# Patient Record
Sex: Male | Born: 1985 | Hispanic: No | Marital: Married | Smoking: Never smoker
Health system: Southern US, Community
[De-identification: ages and names within clinical notes are randomized; demographics above are authoritative.]

## PROBLEM LIST (undated history)

## (undated) DIAGNOSIS — T4145XA Adverse effect of unspecified anesthetic, initial encounter: Secondary | ICD-10-CM

## (undated) DIAGNOSIS — I10 Essential (primary) hypertension: Secondary | ICD-10-CM

## (undated) DIAGNOSIS — R112 Nausea with vomiting, unspecified: Secondary | ICD-10-CM

## (undated) DIAGNOSIS — E7800 Pure hypercholesterolemia, unspecified: Secondary | ICD-10-CM

## (undated) DIAGNOSIS — Z9889 Other specified postprocedural states: Secondary | ICD-10-CM

## (undated) DIAGNOSIS — T8859XA Other complications of anesthesia, initial encounter: Secondary | ICD-10-CM

---
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
## 2019-01-27 ENCOUNTER — Emergency Department (HOSPITAL_BASED_OUTPATIENT_CLINIC_OR_DEPARTMENT_OTHER): Payer: BC Managed Care – PPO

## 2019-01-27 ENCOUNTER — Encounter (HOSPITAL_COMMUNITY)
Admission: EM | Disposition: A | Discharge status: Home / Self Care | Payer: Self-pay | Source: Home / Self Care | Attending: Emergency Medicine

## 2019-01-27 ENCOUNTER — Encounter (HOSPITAL_BASED_OUTPATIENT_CLINIC_OR_DEPARTMENT_OTHER): Payer: Self-pay | Admitting: *Deleted

## 2019-01-27 ENCOUNTER — Observation Stay (HOSPITAL_BASED_OUTPATIENT_CLINIC_OR_DEPARTMENT_OTHER)
Admission: EM | Admit: 2019-01-27 | Discharge: 2019-01-29 | Disposition: A | Discharge status: Home / Self Care | Payer: BC Managed Care – PPO | Attending: Surgery | Admitting: Surgery

## 2019-01-27 ENCOUNTER — Emergency Department (HOSPITAL_COMMUNITY): Payer: BC Managed Care – PPO | Admitting: Certified Registered Nurse Anesthetist

## 2019-01-27 ENCOUNTER — Other Ambulatory Visit: Payer: Self-pay

## 2019-01-27 DIAGNOSIS — E7800 Pure hypercholesterolemia, unspecified: Secondary | ICD-10-CM | POA: Diagnosis not present

## 2019-01-27 DIAGNOSIS — K3580 Unspecified acute appendicitis: Secondary | ICD-10-CM | POA: Diagnosis present

## 2019-01-27 DIAGNOSIS — K3533 Acute appendicitis with perforation and localized peritonitis, with abscess: Secondary | ICD-10-CM | POA: Diagnosis not present

## 2019-01-27 DIAGNOSIS — Z20828 Contact with and (suspected) exposure to other viral communicable diseases: Secondary | ICD-10-CM | POA: Insufficient documentation

## 2019-01-27 DIAGNOSIS — K353 Acute appendicitis with localized peritonitis, without perforation or gangrene: Secondary | ICD-10-CM

## 2019-01-27 DIAGNOSIS — I10 Essential (primary) hypertension: Secondary | ICD-10-CM | POA: Insufficient documentation

## 2019-01-27 DIAGNOSIS — Z79899 Other long term (current) drug therapy: Secondary | ICD-10-CM | POA: Diagnosis not present

## 2019-01-27 DIAGNOSIS — N200 Calculus of kidney: Secondary | ICD-10-CM

## 2019-01-27 DIAGNOSIS — R109 Unspecified abdominal pain: Secondary | ICD-10-CM | POA: Diagnosis present

## 2019-01-27 HISTORY — DX: Other complications of anesthesia, initial encounter: T88.59XA

## 2019-01-27 HISTORY — DX: Pure hypercholesterolemia, unspecified: E78.00

## 2019-01-27 HISTORY — PX: LAPAROSCOPIC APPENDECTOMY: SHX408

## 2019-01-27 HISTORY — DX: Essential (primary) hypertension: I10

## 2019-01-27 HISTORY — DX: Other specified postprocedural states: Z98.890

## 2019-01-27 HISTORY — DX: Other specified postprocedural states: R11.2

## 2019-01-27 LAB — CBC
HCT: 45.8 % (ref 39.0–52.0)
Hemoglobin: 15.4 g/dL (ref 13.0–17.0)
MCH: 30.2 pg (ref 26.0–34.0)
MCHC: 33.6 g/dL (ref 30.0–36.0)
MCV: 89.8 fL (ref 80.0–100.0)
Platelets: 211 10*3/uL (ref 150–400)
RBC: 5.1 MIL/uL (ref 4.22–5.81)
RDW: 12.9 % (ref 11.5–15.5)
WBC: 17.7 10*3/uL — ABNORMAL HIGH (ref 4.0–10.5)
nRBC: 0 % (ref 0.0–0.2)

## 2019-01-27 LAB — COMPREHENSIVE METABOLIC PANEL
ALT: 102 U/L — ABNORMAL HIGH (ref 0–44)
AST: 61 U/L — ABNORMAL HIGH (ref 15–41)
Albumin: 4.8 g/dL (ref 3.5–5.0)
Alkaline Phosphatase: 106 U/L (ref 38–126)
Anion gap: 10 (ref 5–15)
BUN: 10 mg/dL (ref 6–20)
CO2: 24 mmol/L (ref 22–32)
Calcium: 9.5 mg/dL (ref 8.9–10.3)
Chloride: 104 mmol/L (ref 98–111)
Creatinine, Ser: 0.86 mg/dL (ref 0.61–1.24)
GFR calc Af Amer: >60 mL/min (ref >60)
GFR calc non Af Amer: >60 mL/min (ref >60)
Glucose, Bld: 120 mg/dL — ABNORMAL HIGH (ref 70–99)
Potassium: 4.1 mmol/L (ref 3.5–5.1)
Sodium: 138 mmol/L (ref 135–145)
Total Bilirubin: 1.4 mg/dL — ABNORMAL HIGH (ref 0.3–1.2)
Total Protein: 8.5 g/dL — ABNORMAL HIGH (ref 6.5–8.1)

## 2019-01-27 LAB — URINALYSIS, ROUTINE W REFLEX MICROSCOPIC
Bilirubin Urine: NEGATIVE
Glucose, UA: NEGATIVE mg/dL
Hgb urine dipstick: NEGATIVE
Ketones, ur: NEGATIVE mg/dL
Leukocytes,Ua: NEGATIVE
Nitrite: NEGATIVE
Protein, ur: NEGATIVE mg/dL
Specific Gravity, Urine: 1.01 (ref 1.005–1.030)
pH: 7 (ref 5.0–8.0)

## 2019-01-27 LAB — SARS CORONAVIRUS 2 BY RT PCR (HOSPITAL ORDER, PERFORMED IN ~~LOC~~ HOSPITAL LAB): SARS Coronavirus 2: NEGATIVE

## 2019-01-27 LAB — LIPASE, BLOOD: Lipase: 30 U/L (ref 11–51)

## 2019-01-27 SURGERY — APPENDECTOMY, LAPAROSCOPIC
Anesthesia: General

## 2019-01-27 MED ORDER — FENTANYL CITRATE (PF) 100 MCG/2ML IJ SOLN
INTRAMUSCULAR | Status: AC
  Filled 2019-01-27: qty 2

## 2019-01-27 MED ORDER — LACTATED RINGERS IR SOLN
Status: DC | PRN
  Administered 2019-01-27: 1000 mL

## 2019-01-27 MED ORDER — LACTATED RINGERS IV SOLN
INTRAVENOUS | Status: DC
  Administered 2019-01-27 (×2): via INTRAVENOUS

## 2019-01-27 MED ORDER — OXYCODONE HCL 5 MG PO TABS: 5.0000 mg | ORAL_TABLET | Freq: Once | ORAL | Status: DC | PRN

## 2019-01-27 MED ORDER — KCL IN DEXTROSE-NACL 20-5-0.45 MEQ/L-%-% IV SOLN
INTRAVENOUS | Status: DC
  Administered 2019-01-27 – 2019-01-28 (×3): via INTRAVENOUS
  Filled 2019-01-27 (×3): qty 1000

## 2019-01-27 MED ORDER — ACETAMINOPHEN 80 MG PO CHEW
160.0000 mg | CHEWABLE_TABLET | Freq: Once | ORAL | Status: DC
  Filled 2019-01-27: qty 2

## 2019-01-27 MED ORDER — BUPIVACAINE HCL 0.25 % IJ SOLN
INTRAMUSCULAR | Status: DC | PRN
  Administered 2019-01-27: 30 mL

## 2019-01-27 MED ORDER — DEXAMETHASONE SODIUM PHOSPHATE 10 MG/ML IJ SOLN
INTRAMUSCULAR | Status: DC | PRN
  Administered 2019-01-27: 8 mg via INTRAVENOUS

## 2019-01-27 MED ORDER — KETOROLAC TROMETHAMINE 30 MG/ML IJ SOLN: 30.0000 mg | Freq: Once | INTRAMUSCULAR | Status: DC | PRN

## 2019-01-27 MED ORDER — ONDANSETRON HCL 4 MG/2ML IJ SOLN
INTRAMUSCULAR | Status: DC | PRN
  Administered 2019-01-27: 4 mg via INTRAVENOUS

## 2019-01-27 MED ORDER — ASPIRIN 81 MG PO CHEW
CHEWABLE_TABLET | ORAL | Status: AC
  Filled 2019-01-27: qty 2

## 2019-01-27 MED ORDER — IOHEXOL 300 MG/ML  SOLN
100.0000 mL | Freq: Once | INTRAMUSCULAR | Status: AC | PRN
  Administered 2019-01-27: 100 mL via INTRAVENOUS

## 2019-01-27 MED ORDER — ROCURONIUM BROMIDE 10 MG/ML (PF) SYRINGE
PREFILLED_SYRINGE | INTRAVENOUS | Status: AC
  Filled 2019-01-27: qty 10

## 2019-01-27 MED ORDER — HYDROCODONE-ACETAMINOPHEN 5-325 MG PO TABS: 1.0000 | ORAL_TABLET | ORAL | Status: DC | PRN

## 2019-01-27 MED ORDER — ENOXAPARIN SODIUM 40 MG/0.4ML ~~LOC~~ SOLN
40.0000 mg | SUBCUTANEOUS | Status: DC
  Administered 2019-01-28 – 2019-01-29 (×2): 40 mg via SUBCUTANEOUS
  Filled 2019-01-27 (×2): qty 0.4

## 2019-01-27 MED ORDER — LIDOCAINE 2% (20 MG/ML) 5 ML SYRINGE
INTRAMUSCULAR | Status: DC | PRN
  Administered 2019-01-27: 40 mg via INTRAVENOUS

## 2019-01-27 MED ORDER — SODIUM CHLORIDE 0.9 % IV SOLN
2.0000 g | Freq: Once | INTRAVENOUS | Status: AC
  Administered 2019-01-27: 13:00:00 2 g via INTRAVENOUS
  Filled 2019-01-27: qty 20

## 2019-01-27 MED ORDER — SUCCINYLCHOLINE CHLORIDE 200 MG/10ML IV SOSY
PREFILLED_SYRINGE | INTRAVENOUS | Status: AC
  Filled 2019-01-27: qty 10

## 2019-01-27 MED ORDER — SUGAMMADEX SODIUM 200 MG/2ML IV SOLN
INTRAVENOUS | Status: DC | PRN
  Administered 2019-01-27: 200 mg via INTRAVENOUS

## 2019-01-27 MED ORDER — SODIUM CHLORIDE 0.9 % IV BOLUS
1000.0000 mL | Freq: Once | INTRAVENOUS | Status: AC
  Administered 2019-01-27: 1000 mL via INTRAVENOUS

## 2019-01-27 MED ORDER — MIDAZOLAM HCL 2 MG/2ML IJ SOLN
INTRAMUSCULAR | Status: DC | PRN
  Administered 2019-01-27: 2 mg via INTRAVENOUS

## 2019-01-27 MED ORDER — ONDANSETRON 4 MG PO TBDP: 4.0000 mg | ORAL_TABLET | Freq: Four times a day (QID) | ORAL | Status: DC | PRN

## 2019-01-27 MED ORDER — METRONIDAZOLE IN NACL 5-0.79 MG/ML-% IV SOLN
500.0000 mg | Freq: Once | INTRAVENOUS | Status: AC
  Administered 2019-01-27: 500 mg via INTRAVENOUS
  Filled 2019-01-27: qty 100

## 2019-01-27 MED ORDER — ONDANSETRON HCL 4 MG/2ML IJ SOLN
4.0000 mg | Freq: Once | INTRAMUSCULAR | Status: AC
  Administered 2019-01-27: 13:00:00 4 mg via INTRAVENOUS
  Filled 2019-01-27: qty 2

## 2019-01-27 MED ORDER — LIDOCAINE 2% (20 MG/ML) 5 ML SYRINGE
INTRAMUSCULAR | Status: AC
  Filled 2019-01-27: qty 5

## 2019-01-27 MED ORDER — TRAMADOL HCL 50 MG PO TABS
50.0000 mg | ORAL_TABLET | Freq: Four times a day (QID) | ORAL | Status: DC | PRN
  Administered 2019-01-27: 50 mg via ORAL
  Filled 2019-01-27: qty 1

## 2019-01-27 MED ORDER — PROPOFOL 10 MG/ML IV BOLUS
INTRAVENOUS | Status: AC
  Filled 2019-01-27: qty 20

## 2019-01-27 MED ORDER — ACETAMINOPHEN 325 MG PO TABS
ORAL_TABLET | ORAL | Status: AC
  Administered 2019-01-27: 325 mg
  Filled 2019-01-27: qty 1

## 2019-01-27 MED ORDER — PROPOFOL 10 MG/ML IV BOLUS
INTRAVENOUS | Status: DC | PRN
  Administered 2019-01-27: 150 mg via INTRAVENOUS

## 2019-01-27 MED ORDER — OXYCODONE HCL 5 MG/5ML PO SOLN: 5.0000 mg | Freq: Once | ORAL | Status: DC | PRN

## 2019-01-27 MED ORDER — MIDAZOLAM HCL 2 MG/2ML IJ SOLN
INTRAMUSCULAR | Status: AC
  Filled 2019-01-27: qty 2

## 2019-01-27 MED ORDER — FENTANYL CITRATE (PF) 100 MCG/2ML IJ SOLN: 25.0000 ug | INTRAMUSCULAR | Status: DC | PRN

## 2019-01-27 MED ORDER — BUPIVACAINE HCL (PF) 0.25 % IJ SOLN
INTRAMUSCULAR | Status: AC
  Filled 2019-01-27: qty 30

## 2019-01-27 MED ORDER — MORPHINE SULFATE (PF) 2 MG/ML IV SOLN: 1.0000 mg | INTRAVENOUS | Status: DC | PRN

## 2019-01-27 MED ORDER — ROCURONIUM BROMIDE 10 MG/ML (PF) SYRINGE
PREFILLED_SYRINGE | INTRAVENOUS | Status: DC | PRN
  Administered 2019-01-27: 50 mg via INTRAVENOUS

## 2019-01-27 MED ORDER — MORPHINE SULFATE (PF) 4 MG/ML IV SOLN
4.0000 mg | Freq: Once | INTRAVENOUS | Status: AC
  Administered 2019-01-27: 4 mg via INTRAVENOUS
  Filled 2019-01-27: qty 1

## 2019-01-27 MED ORDER — DEXAMETHASONE SODIUM PHOSPHATE 10 MG/ML IJ SOLN
INTRAMUSCULAR | Status: AC
  Filled 2019-01-27: qty 1

## 2019-01-27 MED ORDER — FENTANYL CITRATE (PF) 100 MCG/2ML IJ SOLN
INTRAMUSCULAR | Status: DC | PRN
  Administered 2019-01-27: 50 ug via INTRAVENOUS
  Administered 2019-01-27: 100 ug via INTRAVENOUS
  Administered 2019-01-27 (×2): 50 ug via INTRAVENOUS
  Administered 2019-01-27: 100 ug via INTRAVENOUS
  Administered 2019-01-27: 50 ug via INTRAVENOUS

## 2019-01-27 MED ORDER — SUCCINYLCHOLINE CHLORIDE 200 MG/10ML IV SOSY
PREFILLED_SYRINGE | INTRAVENOUS | Status: DC | PRN
  Administered 2019-01-27: 120 mg via INTRAVENOUS

## 2019-01-27 MED ORDER — ONDANSETRON HCL 4 MG/2ML IJ SOLN
INTRAMUSCULAR | Status: AC
  Filled 2019-01-27: qty 2

## 2019-01-27 MED ORDER — LABETALOL HCL 5 MG/ML IV SOLN
INTRAVENOUS | Status: DC | PRN
  Administered 2019-01-27: 5 mg via INTRAVENOUS

## 2019-01-27 MED ORDER — SODIUM CHLORIDE 0.9 % IV SOLN
INTRAVENOUS | Status: DC | PRN
  Administered 2019-01-27: 250 mL via INTRAVENOUS

## 2019-01-27 MED ORDER — PROMETHAZINE HCL 25 MG/ML IJ SOLN: 6.2500 mg | INTRAMUSCULAR | Status: DC | PRN

## 2019-01-27 MED ORDER — ONDANSETRON HCL 4 MG/2ML IJ SOLN: 4.0000 mg | Freq: Four times a day (QID) | INTRAMUSCULAR | Status: DC | PRN

## 2019-01-27 MED ORDER — PIPERACILLIN-TAZOBACTAM 3.375 G IVPB
3.3750 g | Freq: Three times a day (TID) | INTRAVENOUS | Status: AC
  Administered 2019-01-27 – 2019-01-29 (×5): 3.375 g via INTRAVENOUS
  Filled 2019-01-27 (×5): qty 50

## 2019-01-27 SURGICAL SUPPLY — 43 items
APPLIER CLIP ROT 10 11.4 M/L (STAPLE)
CABLE HIGH FREQUENCY MONO STRZ (ELECTRODE) IMPLANT
CHLORAPREP W/TINT 26 (MISCELLANEOUS) ×3 IMPLANT
CLIP APPLIE ROT 10 11.4 M/L (STAPLE) IMPLANT
COVER SURGICAL LIGHT HANDLE (MISCELLANEOUS) ×3 IMPLANT
COVER WAND RF STERILE (DRAPES) IMPLANT
CUTTER FLEX LINEAR 45M (STAPLE) ×3 IMPLANT
DECANTER SPIKE VIAL GLASS SM (MISCELLANEOUS) IMPLANT
DERMABOND ADVANCED (GAUZE/BANDAGES/DRESSINGS) ×2
DERMABOND ADVANCED .7 DNX12 (GAUZE/BANDAGES/DRESSINGS) ×1 IMPLANT
DEVICE TROCAR PUNCTURE CLOSURE (ENDOMECHANICALS) ×3 IMPLANT
DRAPE LAPAROSCOPIC ABDOMINAL (DRAPES) ×3 IMPLANT
ELECT REM PT RETURN 15FT ADLT (MISCELLANEOUS) ×3 IMPLANT
ENDOLOOP SUT PDS II  0 18 (SUTURE)
ENDOLOOP SUT PDS II 0 18 (SUTURE) IMPLANT
GLOVE BIOGEL M 8.0 STRL (GLOVE) ×3 IMPLANT
GOWN STRL REUS W/TWL XL LVL3 (GOWN DISPOSABLE) ×3 IMPLANT
KIT BASIN OR (CUSTOM PROCEDURE TRAY) ×3 IMPLANT
KIT TURNOVER KIT A (KITS) IMPLANT
PAD POSITIONING PINK XL (MISCELLANEOUS) IMPLANT
POUCH RETRIEVAL ECOSAC 10 (ENDOMECHANICALS) ×1 IMPLANT
POUCH RETRIEVAL ECOSAC 10MM (ENDOMECHANICALS) ×2
POUCH SPECIMEN RETRIEVAL 10MM (ENDOMECHANICALS) IMPLANT
RELOAD 45 THICK GREEN (ENDOMECHANICALS) ×3 IMPLANT
RELOAD 45 VASCULAR/THIN (ENDOMECHANICALS) IMPLANT
RELOAD STAPLE TA45 3.5 REG BLU (ENDOMECHANICALS) IMPLANT
SCISSORS LAP 5X45 EPIX DISP (ENDOMECHANICALS) ×3 IMPLANT
SET IRRIG TUBING LAPAROSCOPIC (IRRIGATION / IRRIGATOR) ×3 IMPLANT
SET TUBE SMOKE EVAC HIGH FLOW (TUBING) ×3 IMPLANT
SHEARS HARMONIC ACE PLUS 45CM (MISCELLANEOUS) ×3 IMPLANT
SLEEVE XCEL OPT CAN 5 100 (ENDOMECHANICALS) ×3 IMPLANT
STAPLER VISISTAT 35W (STAPLE) IMPLANT
SUT MNCRL AB 4-0 PS2 18 (SUTURE) ×3 IMPLANT
SUT VICRYL 0 UR6 27IN ABS (SUTURE) ×3 IMPLANT
TOWEL OR 17X26 10 PK STRL BLUE (TOWEL DISPOSABLE) ×3 IMPLANT
TRAY FOLEY MTR SLVR 14FR STAT (SET/KITS/TRAYS/PACK) IMPLANT
TRAY FOLEY MTR SLVR 16FR STAT (SET/KITS/TRAYS/PACK) IMPLANT
TRAY LAPAROSCOPIC (CUSTOM PROCEDURE TRAY) ×3 IMPLANT
TROCAR BLADELESS OPT 5 100 (ENDOMECHANICALS) ×6 IMPLANT
TROCAR UNIVERSAL OPT 12M 100M (ENDOMECHANICALS) ×3 IMPLANT
TROCAR XCEL 12X100 BLDLESS (ENDOMECHANICALS) ×3 IMPLANT
TROCAR XCEL BLUNT TIP 100MML (ENDOMECHANICALS) ×3 IMPLANT
TROCAR XCEL NON-BLD 11X100MML (ENDOMECHANICALS) IMPLANT

## 2019-01-27 NOTE — ED Notes (Signed)
abd pain onset yesterday  Some nausea  Comes and goes  Denies vomiting and diarrhea

## 2019-01-27 NOTE — ED Notes (Signed)
Patient transported to surgery with Jeannene Patella, RN. Consent signed. Clothing sent with patient. VSS.

## 2019-01-27 NOTE — Anesthesia Postprocedure Evaluation (Signed)
Anesthesia Post Note  Patient: Antonio Marsh  Procedure(s) Performed: APPENDECTOMY LAPAROSCOPIC (N/A )     Patient location during evaluation: PACU Anesthesia Type: General Level of consciousness: awake and alert Pain management: pain level controlled Vital Signs Assessment: post-procedure vital signs reviewed and stable Respiratory status: spontaneous breathing, nonlabored ventilation, respiratory function stable and patient connected to nasal cannula oxygen Cardiovascular status: blood pressure returned to baseline and stable Postop Assessment: no apparent nausea or vomiting Anesthetic complications: no    Last Vitals:  Vitals:   01/27/19 1800 01/27/19 1815  BP: (!) 141/84 (!) 141/92  Pulse: (!) 108 (!) 114  Resp: 18 16  Temp: 36.6 C   SpO2: 97% 96%    Last Pain:  Vitals:   01/27/19 1815  TempSrc:   PainSc: 0-No pain                 Roark Rufo S

## 2019-01-27 NOTE — Anesthesia Procedure Notes (Signed)
Procedure Name: Intubation Date/Time: 01/27/2019 4:40 PM Performed by: Niel Hummer, CRNA Pre-anesthesia Checklist: Patient identified, Emergency Drugs available, Suction available and Patient being monitored Patient Re-evaluated:Patient Re-evaluated prior to induction Oxygen Delivery Method: Circle system utilized Preoxygenation: Pre-oxygenation with 100% oxygen Induction Type: IV induction and Rapid sequence Laryngoscope Size: Mac and 4 Grade View: Grade II Tube type: Oral Tube size: 7.5 mm Number of attempts: 1 Airway Equipment and Method: Stylet Placement Confirmation: ETT inserted through vocal cords under direct vision,  positive ETCO2 and breath sounds checked- equal and bilateral Secured at: 22 cm Tube secured with: Tape Dental Injury: Teeth and Oropharynx as per pre-operative assessment

## 2019-01-27 NOTE — ED Provider Notes (Signed)
Patient was transferred from [facility] for treatment of appendicitis.  Patient is currently stable.  He states his pain is manageable.  I will notify general surgery that patient has arrived.   Dorie Rank, MD 01/27/19 [phone]

## 2019-01-27 NOTE — ED Triage Notes (Signed)
Abdominal pain since yesterday. Bloating.

## 2019-01-27 NOTE — Transfer of Care (Signed)
Immediate Anesthesia Transfer of Care Note  Patient: Antonio Marsh  Procedure(s) Performed: APPENDECTOMY LAPAROSCOPIC (N/A )  Patient Location: PACU  Anesthesia Type:General  Level of Consciousness: awake, alert , oriented and patient cooperative  Airway & Oxygen Therapy: Patient Spontanous Breathing and Patient connected to face mask oxygen  Post-op Assessment: Report given to RN, Post -op Vital signs reviewed and stable and Patient moving all extremities X 4  Post vital signs: stable  Last Vitals:  Vitals Value Taken Time  BP 141/84 01/27/19 1800  Temp 36.6 C 01/27/19 1800  Pulse 101 01/27/19 1806  Resp 4 01/27/19 1805  SpO2 96 % 01/27/19 1806  Vitals shown include unvalidated device data.  Last Pain:  Vitals:   01/27/19 1611  TempSrc:   PainSc: 4          Complications: No apparent anesthesia complications

## 2019-01-27 NOTE — Op Note (Addendum)
Re:   Antonio Marsh DOB:   August 11, 1985 MRN:   [id number]                   FACILITY:  [facility]  DATE OF PROCEDURE: 01/27/2019                              OPERATIVE REPORT  PREOPERATIVE DIAGNOSIS:  Appendicitis  POSTOPERATIVE DIAGNOSIS:  Acute appendicitis with focal abscess.  Possible bulbous mass of cecum at base of appendix.  PROCEDURE:  Laparoscopic appendectomy.  SURGEON:  Sandria Bales. Ezzard Standing, MD  ASSISTANT:  No first assistant.  ANESTHESIA:  General endotracheal.  Anesthesiologist: Eilene Ghazi, MD CRNA: Illene Silver, CRNA; Nelle Don, CRNA  ASA:  2E  ESTIMATED BLOOD LOSS:  Minimal.  DRAINS: none   SPECIMEN:   Appendix  COUNTS CORRECT:  YES  INDICATIONS FOR PROCEDURE: Antonio Marsh is a 33 y.o. (DOB: 27-Dec-1985) Hispanic male whose primary care doctor is Wilfred Curtis, MD and comes to the OR for an appendectomy.   I discussed with the patient, the indications and potential complications of appendiceal surgery.  The potential complications include, but are not limited to, bleeding, open surgery, bowel resection, and the possibility of another diagnosis.  OPERATIVE NOTE:  The patient underwent a general endotracheal anesthetic as supervised by Anesthesiologist: Eilene Ghazi, MD CRNA: Illene Silver, CRNA; Nelle Don, CRNA, General, in [place] room #1.  The patient was given 2 g of cefoxitin at the beginning of the procedure and the abdomen was prepped with ChloraPrep.   A time-out was held and surgical checklist run.  An infraumbilical incision was made with sharp dissection carried down to the abdominal cavity.  An 12 mm Hasson trocar was inserted through the infraumbilical incision and into the peritoneal cavity.  A 30 degree 5 mm laparoscope was inserted through a 12 mm Hasson trocar and the Hasson trocar secured with a 0 Vicryl suture.  I placed a 5 mm trocar in the right upper quadrant and a 5 mm torcar in left lower quadrant and did abdominal exploration.   I  had to upsize the 5 mm trocar in the RUQ to a 12 mm trocar for a better angle with the stapler.  The right and left lobes of liver unremarkable.  Stomach was unremarkable.  The pelvic organs were unremarkable.  I saw no other intra-abdominal abnormality.  The patient had acute turgid appendicitis with the appendix located at the right pelvic brim.  The base of the appendix was retrocecal.  I had to mobilize the cecum to get to the base of the appendix.  There was a small abscess associated with the retrocecal base of appendix.  This is a class 4 wound.  The mesentery of the appendix was divided with a Harmonic scalpel.  I got to the base of the appendix.  I then used a green load 45 mm Ethicon Endo-GIA stapler and fired this across the base of the appendix.  There was this bulbous portion of the cecum which is probably just inflammatory, but we will have to see what the pathology shows.  I placed the appendix in Eco Sac bag and delivered the bag through the umbilical incision.  I irrigated the abdomen with 800 cc of saline.  After irrigating the abdomen, I then removed the trocars, in turn.  The umbilical port fascia was closed with 0 Vicryl suture.  I closed the RUQ trocar  site with a 0 vicryl suture. I closed the skin each site with a 4-0 Monocryl suture and painted the wounds with DermaBond.  I then injected a total of 30 mL of 0.25% Marcaine at the incisions.  Sponge and needle count were correct at the end of the case.  The patient was transferred to the recovery room in good condition.  Because of the bulbous changes at the base of the appendix, which may be just inflammatory, will need to review pathology and consider colonoscopy in 3 months or so.  The patient tolerated the procedure well and it depends on the patient's post op clinical course as to when the patient could be discharged.   Alphonsa Overall, MD, [facility] Pager: [phone] Office phone:   [phone]

## 2019-01-27 NOTE — ED Notes (Signed)
Txfr from [facility], Dx acute appendicitis

## 2019-01-27 NOTE — Anesthesia Preprocedure Evaluation (Signed)
Anesthesia Evaluation  Patient identified by MRN, date of birth, ID band Patient awake    Reviewed: Allergy & Precautions, NPO status , Patient's Chart, lab work & pertinent test results  Airway Mallampati: II  TM Distance: >3 FB Neck ROM: Full    Dental no notable dental hx.    Pulmonary neg pulmonary ROS,    Pulmonary exam normal breath sounds clear to auscultation       Cardiovascular hypertension, Pt. on medications negative cardio ROS Normal cardiovascular exam Rhythm:Regular Rate:Normal     Neuro/Psych negative neurological ROS  negative psych ROS   GI/Hepatic negative GI ROS, (+)     substance abuse  alcohol use,   Endo/Other  negative endocrine ROS  Renal/GU negative Renal ROS  negative genitourinary   Musculoskeletal negative musculoskeletal ROS (+)   Abdominal   Peds negative pediatric ROS (+)  Hematology negative hematology ROS (+)   Anesthesia Other Findings   Reproductive/Obstetrics negative OB ROS                             Anesthesia Physical Anesthesia Plan  ASA: II  Anesthesia Plan: General   Post-op Pain Management:    Induction: Intravenous and Rapid sequence  PONV Risk Score and Plan: 2 and Ondansetron, Treatment may vary due to age or medical condition and Dexamethasone  Airway Management Planned: Oral ETT  Additional Equipment:   Intra-op Plan:   Post-operative Plan: Extubation in OR  Informed Consent: I have reviewed the patients History and Physical, chart, labs and discussed the procedure including the risks, benefits and alternatives for the proposed anesthesia with the patient or authorized representative who has indicated his/her understanding and acceptance.     Dental advisory given  Plan Discussed with: CRNA and Surgeon  Anesthesia Plan Comments:         Anesthesia Quick Evaluation

## 2019-01-27 NOTE — Progress Notes (Signed)
Pt's heart rate was elevated in PACU also.

## 2019-01-27 NOTE — H&P (Signed)
Re:   Antonio Marsh DOB:   1985-05-25 MRN:   [id number]  Chief Complaint Abdominal pain  ASSESEMENT AND PLAN: 1.  Acute appendicitis  I discussed with the patient the indications and risks of appendiceal surgery.  The primary risks of appendiceal surgery include, but are not limited to, bleeding, infection, bowel surgery, and open surgery.  There is also the risk that the patient may have continued symptoms after surgery.  We discussed the typical post-operative recovery course. I tried to answer the patient's questions.  2.  Left ureterovesical stone on CT scan    Chief Complaint  Patient presents with  . Abdominal Pain   PHYSICIAN REQUESTING CONSULTATION: Harlene Salts, PA, [facility]  HISTORY OF PRESENT ILLNESS: Antonio Marsh is a 33 y.o. (DOB: April 27, 1985)  Hispanic male whose primary care physician is Wilfred Curtis, MD.   He developed abdominal bloating and diffuse abdominal tenderness yesterday, 11/15.   He said that he had a similar episode about one year ago, but this resolved. He has no prior history of stomach, liver, or colon disease.  He has had no prior abdominal surgery  Because of increasing discomfort, he came to the [facility].  CT scan of abdomen - 01/27/2019 - 1.  Findings indicative of acute appendicitis.  2. 2 mm calculus left ureterovesical junction. No appreciable hydronephrosis.  3. Prominent lymph nodes in the right lower quadrant may well be reactive in etiology given the appendicitis. WBC - 01/27/2019 - 17,700    Past Medical History:  Diagnosis Date  . High cholesterol   . Hypertension      History reviewed. No pertinent surgical history.    Current Facility-Administered Medications  Medication Dose Route Frequency Provider Last Rate Last Dose  . 0.9 %  sodium chloride infusion   Intravenous PRN Terrilee Files, MD   Stopped at 01/27/19 1439   Current Outpatient Medications  Medication Sig Dispense Refill  .  amLODipine (NORVASC) 10 MG tablet Take by mouth.    . famotidine (PEPCID) 20 MG tablet TAKE 1 TABLET(20 MG) BY MOUTH TWICE DAILY.    . fenofibrate 160 MG tablet Take by mouth.       No Known Allergies  REVIEW OF SYSTEMS: Skin:  No history of rash.  No history of abnormal moles. Infection:  No history of hepatitis or HIV.  No history of MRSA. Neurologic:  No history of stroke.  No history of seizure.  No history of headaches. Cardiac:  No history of hypertension. No history of heart disease.  No history of prior cardiac catheterization.  No history of seeing a cardiologist. Pulmonary:  Does not smoke cigarettes.  No asthma or bronchitis.  No OSA/CPAP.  Endocrine:  No diabetes. No thyroid disease. Gastrointestinal:  See HPI Urologic:  He has a left kidney stone on CT scan, but has never had symptoms. Musculoskeletal:  No history of joint or back disease. Hematologic:  No bleeding disorder.  No history of anemia.  Not anticoagulated. Psycho-social:  The patient is oriented.   The patient has no obvious psychologic or social impairment to understanding our conversation and plan.  SOCIAL and FAMILY HISTORY: Married. Wife, Janace Litten (cell: [phone]), is on the premises. He has 2 sons.      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  He is originally from [place], but has lived here since 2004.  PHYSICAL EXAM: BP 140/81 (BP Location: Right Arm)  Pulse (!) 116   Temp 100.2 F (37.9 C) (Oral)   Resp 18   Ht 5\' 4"  (1.626 m)   Wt 68.5 kg   SpO2 99%   BMI 25.92 kg/m   General: WN Hispanic male who is alert and generally healthy appearing.   He is wearing a [facility] mask! Skin:  Inspection and palpation - no mass or rash. Eyes:  Conjunctiva and lids unremarkable.            Pupils are equal Ears, Nose, Mouth, and Throat:  He is wearing a mask. Neck: Supple. No mass, trachea midline.  No thyroid mass. Lymph Nodes:  No supraclavicular, cervical, or  inguinal nodes. Lungs: Normal respiratory effort.  Clear to auscultation and symmetric breath sounds. Heart:  Palpation of the heart is normal.            Auscultation: RRR. No murmur or rub.  Abdomen: Soft. No mass. Right lower quadrant tenderness. No hernia. Some bowel sounds.  No abdominal scars. Rectal: Not done. Musculoskeletal:  Normal gait.            Good muscle strength and ROM  in upper and lower extremities.  Neurologic:  Grossly intact to motor and sensory function. Psychiatric: Normal judgement and insight. Behavior is normal.            Oriented to time, person, place.   DATA REVIEWED, COUNSELING AND COORDINATION OF CARE: Epic notes reviewed. Counseling and coordination of care exceeded more than 50% of the time spent with patient. Total time spent with patient and charting: 45 minutes  Alphonsa Overall, MD,  [facility], [address]  [place], [place]    [place] Phone:  [phone] FAX:  [phone]

## 2019-01-27 NOTE — ED Provider Notes (Signed)
[facility] EMERGENCY DEPARTMENT Provider Note   CSN: [id number] Arrival date & time: 01/27/19  1038     History   Chief Complaint Chief Complaint  Patient presents with  . Abdominal Pain    HPI Antonio Marsh is a 33 y.o. male history hypertension and high cholesterol.  Patient reports onset of diffuse lower abdominal pain yesterday, gradually worsening now describes a moderate intensity constant aching sensation worsened with palpation no alleviating factors, no radiation of pain.  Reports similar pain last year that self resolved.  Denies fever/chills, headache, chest pain/shortness of breath, nausea/vomiting, diarrhea, dysuria/hematuria, testicular pain/swelling, fall/injury or any additional concerns today.     HPI  Past Medical History:  Diagnosis Date  . High cholesterol   . Hypertension     There are no active problems to display for this patient.   History reviewed. No pertinent surgical history.      Home Medications    Prior to Admission medications   Not on File    Family History No family history on file.  Social History Social History   Tobacco Use  . Smoking status: Never Smoker  . Smokeless tobacco: Never Used  Substance Use Topics  . Alcohol use: Yes  . Drug use: Never     Allergies   Patient has no known allergies.   Review of Systems Review of Systems Ten systems are reviewed and are negative for acute change except as noted in the HPI   Physical Exam Updated Vital Signs BP 140/83   Pulse 99   Temp 99.9 F (37.7 C) (Oral)   Resp 20   Ht 5\' 4"  (1.626 m)   Wt 68.5 kg   SpO2 99%   BMI 25.92 kg/m   Physical Exam Constitutional:      General: He is not in acute distress.    Appearance: Normal appearance. He is well-developed. He is not ill-appearing or diaphoretic.  HENT:     Head: Normocephalic and atraumatic.     Right Ear: External ear normal.     Left Ear: External ear normal.     Nose: Nose normal.   Eyes:     General: Vision grossly intact. Gaze aligned appropriately.     Pupils: Pupils are equal, round, and reactive to light.  Neck:     Musculoskeletal: Normal range of motion.     Trachea: Trachea and phonation normal. No tracheal deviation.  Cardiovascular:     Rate and Rhythm: Normal rate and regular rhythm.  Pulmonary:     Effort: Pulmonary effort is normal. No respiratory distress.  Abdominal:     General: There is no distension.     Palpations: Abdomen is soft.     Tenderness: There is abdominal tenderness in the right lower quadrant, suprapubic area and left lower quadrant. There is no guarding or rebound.  Genitourinary:    Comments: Deferred by patient. Musculoskeletal: Normal range of motion.  Skin:    General: Skin is warm and dry.  Neurological:     Mental Status: He is alert.     GCS: GCS eye subscore is 4. GCS verbal subscore is 5. GCS motor subscore is 6.     Comments: Speech is clear and goal oriented, follows commands Major Cranial nerves without deficit, no facial droop Moves extremities without ataxia, coordination intact  Psychiatric:        Behavior: Behavior normal.      ED Treatments / Results  Labs (all labs ordered are listed,  but only abnormal results are displayed) Labs Reviewed  COMPREHENSIVE METABOLIC PANEL - Abnormal; Notable for the following components:      Result Value   Glucose, Bld 120 (*)    Total Protein 8.5 (*)    AST 61 (*)    ALT 102 (*)    Total Bilirubin 1.4 (*)    All other components within normal limits  CBC - Abnormal; Notable for the following components:   WBC 17.7 (*)    All other components within normal limits  LIPASE, BLOOD  URINALYSIS, ROUTINE W REFLEX MICROSCOPIC    EKG None  Radiology No results found.  Procedures Procedures (including critical care time)  Medications Ordered in ED Medications  cefTRIAXone (ROCEPHIN) 2 g in sodium chloride 0.9 % 100 mL IVPB (has no administration in time range)     And  metroNIDAZOLE (FLAGYL) IVPB 500 mg (has no administration in time range)  sodium chloride 0.9 % bolus 1,000 mL (1,000 mLs Intravenous New Bag/Given 01/27/19 1226)  iohexol (OMNIPAQUE) 300 MG/ML solution 100 mL (100 mLs Intravenous Contrast Given 01/27/19 1210)     Initial Impression / Assessment and Plan / ED Course  I have reviewed the triage vital signs and the nursing notes.  Pertinent labs & imaging results that were available during my care of the patient were reviewed by me and considered in my medical decision making (see chart for details).  Clinical Course as of Jan 27 1312  Mon Jan 27, 2019  1308 Dr. Rush Landmarkegeler   [BM]    Clinical Course User Index [BM] Harlene SaltsMorelli, Brandon A, PA-C   CBC with leukocytosis of 17.7 Lipase within normal limits Urinalysis within normal limits CMP with AST 61, ALT 102, total bilirubin 1.4, glucose 120, protein 8.5  CTAP:  IMPRESSION:  1. Findings indicative of acute appendicitis.    Appendix: Location: Appendix arises medially from the cecum at the  level of L5-S1.    Diameter: 1.7 cm    Appendicolith: None    Mucosal hyper-enhancement: Present    Extraluminal gas: None    Periappendiceal collection: Slight fluid and moderate soft tissue  stranding track along the inflamed appendix. No abscess evident.    2. 2 mm calculus left ureterovesical junction. No appreciable  hydronephrosis.    3. Prominent lymph nodes in the right lower quadrant may well be  reactive in etiology given the appendicitis.    4. No bowel obstruction. No abscess in the abdomen or pelvis.  - Patient has received IV fluids, Rocephin and Flagyl ordered, consult placed to general surgery.  Patient reevaluated resting comfortably no acute distress states understanding of diagnosis today and is agreeable for transfer and general surgery evaluation. - Discussed case with general surgery Will Casimer BilisJennings PA-C, advises rapid Covid testing [facility]  [facility]. - [facility] to coordinate transfer.  Discussed case with Dr. Rush Landmarkegeler, accepting [facility].  Marva RN [facility] - COVID-19 negative  [facility].  Case was discussed with Dr. Charm BargesButler during this visit.  Note: Portions of this report may have been transcribed using voice recognition software. Every effort was made to ensure accuracy; however, inadvertent computerized transcription errors may still be present. Final Clinical Impressions(s) / ED Diagnoses   Final diagnoses:  Acute appendicitis with localized peritonitis, without perforation, abscess, or gangrene  Kidney stone    ED Discharge Orders    None  Bill Salinas, PA-C 01/27/19 1448    Terrilee Files, MD 01/27/19 [phone]

## 2019-01-28 ENCOUNTER — Encounter (HOSPITAL_COMMUNITY): Payer: Self-pay | Admitting: Surgery

## 2019-01-28 LAB — COMPREHENSIVE METABOLIC PANEL
ALT: 65 U/L — ABNORMAL HIGH (ref 0–44)
AST: 36 U/L (ref 15–41)
Albumin: 4 g/dL (ref 3.5–5.0)
Alkaline Phosphatase: 72 U/L (ref 38–126)
Anion gap: 9 (ref 5–15)
BUN: 8 mg/dL (ref 6–20)
CO2: 23 mmol/L (ref 22–32)
Calcium: 9 mg/dL (ref 8.9–10.3)
Chloride: 105 mmol/L (ref 98–111)
Creatinine, Ser: 0.86 mg/dL (ref 0.61–1.24)
GFR calc Af Amer: >60 mL/min (ref >60)
GFR calc non Af Amer: >60 mL/min (ref >60)
Glucose, Bld: 138 mg/dL — ABNORMAL HIGH (ref 70–99)
Potassium: 3.6 mmol/L (ref 3.5–5.1)
Sodium: 137 mmol/L (ref 135–145)
Total Bilirubin: 1.2 mg/dL (ref 0.3–1.2)
Total Protein: 7.3 g/dL (ref 6.5–8.1)

## 2019-01-28 MED ORDER — OXYCODONE HCL 5 MG PO TABS: 5.0000 mg | ORAL_TABLET | Freq: Four times a day (QID) | ORAL | 0 refills | Status: AC | PRN

## 2019-01-28 MED ORDER — AMOXICILLIN-POT CLAVULANATE 875-125 MG PO TABS: 1.0000 | ORAL_TABLET | Freq: Two times a day (BID) | ORAL | 0 refills | Status: AC

## 2019-01-28 MED ORDER — AMOXICILLIN-POT CLAVULANATE 875-125 MG PO TABS
1.0000 | ORAL_TABLET | Freq: Two times a day (BID) | ORAL | Status: DC
  Administered 2019-01-29: 1 via ORAL
  Filled 2019-01-28: qty 1

## 2019-01-28 MED ORDER — ACETAMINOPHEN 500 MG PO TABS
1000.0000 mg | ORAL_TABLET | Freq: Three times a day (TID) | ORAL | Status: DC
  Administered 2019-01-28 – 2019-01-29 (×3): 1000 mg via ORAL
  Filled 2019-01-28 (×3): qty 2

## 2019-01-28 MED ORDER — OXYCODONE HCL 5 MG PO TABS
5.0000 mg | ORAL_TABLET | ORAL | Status: DC | PRN
  Administered 2019-01-29: 10 mg via ORAL
  Filled 2019-01-28: qty 2

## 2019-01-28 MED ORDER — ACETAMINOPHEN 500 MG PO TABS: ORAL_TABLET | ORAL | 0 refills | Status: AC

## 2019-01-28 MED ORDER — IBUPROFEN 200 MG PO TABS: ORAL_TABLET | ORAL | Status: AC

## 2019-01-28 MED ORDER — IBUPROFEN 200 MG PO TABS
600.0000 mg | ORAL_TABLET | Freq: Four times a day (QID) | ORAL | Status: DC | PRN
  Filled 2019-01-28: qty 3

## 2019-01-28 NOTE — Discharge Instructions (Signed)
[facility] ______[facility] °LAPAROSCOPIC SURGERY: POST OP INSTRUCTIONS °Always review your discharge instruction sheet given to you by the facility where your surgery was performed. °IF YOU HAVE DISABILITY OR FAMILY LEAVE FORMS, YOU MUST BRING THEM TO THE OFFICE FOR PROCESSING.   °DO NOT GIVE THEM TO YOUR DOCTOR. ° °1. A prescription for pain medication may be given to you upon discharge.  Take your pain medication as prescribed, if needed.  If narcotic pain medicine is not needed, then you may take acetaminophen (Tylenol) or ibuprofen (Advil) as needed. °2. Take your usually prescribed medications unless otherwise directed. °3. If you need a refill on your pain medication, please contact your pharmacy.  They will contact our office to request authorization. Prescriptions will not be filled after 5pm or on week-ends. °4. You should follow a light diet the first few days after arrival home, such as soup and crackers, etc.  Be sure to include lots of fluids daily. °5. Most patients will experience some swelling and bruising in the area of the incisions.  Ice packs will help.  Swelling and bruising can take several days to resolve.  °6. It is common to experience some constipation if taking pain medication after surgery.  Increasing fluid intake and taking a stool softener (such as Colace) will usually help or prevent this problem from occurring.  A mild laxative (Milk of Magnesia or Miralax) should be taken according to package instructions if there are no bowel movements after 48 hours. °7. Unless discharge instructions indicate otherwise, you may remove your bandages 24-48 hours after surgery, and you may shower at that time.  You may have steri-strips (small skin tapes) in place directly over the incision.  These strips should be left on the skin for 7-10 days.  If your surgeon used skin glue on the incision, you may shower in 24 hours.  The glue will flake off over the next 2-3 weeks.  Any sutures or  staples will be removed at the office during your follow-up visit. °8. ACTIVITIES:  You may resume regular (light) daily activities beginning the next day--such as daily self-care, walking, climbing stairs--gradually increasing activities as tolerated.  You may have sexual intercourse when it is comfortable.  Refrain from any heavy lifting or straining until approved by your doctor. °a. You may drive when you are no longer taking prescription pain medication, you can comfortably wear a seatbelt, and you can safely maneuver your car and apply brakes. °b. RETURN TO WORK:  __________________________________________________________ °9. You should see your doctor in the office for a follow-up appointment approximately 2-3 weeks after your surgery.  Make sure that you call for this appointment within a day or two after you arrive home to insure a convenient appointment time. °10. OTHER INSTRUCTIONS: __________________________________________________________________________________________________________________________ __________________________________________________________________________________________________________________________ °WHEN TO CALL YOUR DOCTOR: °1. Fever over 101.0 °2. Inability to urinate °3. Continued bleeding from incision. °4. Increased pain, redness, or drainage from the incision. °5. Increasing abdominal pain ° °The clinic staff is available to answer your questions during regular business hours.  Please don’t hesitate to call and ask to speak to one of the nurses for clinical concerns.  If you have a medical emergency, go to the nearest emergency room or call 911.  A surgeon from [facility] is always on call at the hospital. °[address], [address], [place], [place]  [address] ? [address] [address], [place],    [address] °[phone] ? [phone] ? FAX [phone] °Web site:   [email or web address]     Laparoscopic Appendectomy, Adult, Care After This  sheet gives you information about how to care for yourself after your procedure. Your doctor may also give you more specific instructions. If you have problems or questions, contact your doctor. What can I expect after the procedure? After the procedure, it is common to have:  Little energy for normal activities.  Mild pain in the area where the cuts from surgery (incisions) were made.  Trouble pooping (constipation). This can be caused by: ? Pain medicine. ? A lack of activity. Follow these instructions at home: Medicines  Take over-the-counter and prescription medicines only as told by your doctor.  If you were prescribed an antibiotic medicine, take it as told by your doctor. Do not stop taking it even if you start to feel better.  Do not drive or use heavy machinery while taking prescription pain medicine.  Ask your doctor if the medicine you are taking can cause trouble pooping. You may need to take steps to prevent or treat trouble pooping: ? Drink enough fluid to keep your pee (urine) pale yellow. ? Take over-the-counter or prescription medicines. ? Eat foods that are high in fiber. These include beans, whole grains, and fresh fruits and vegetables. ? Limit foods that are high in fat and sugar. These include fried or sweet foods. Incision care   Follow instructions from your doctor about how to take care of your cuts from surgery. Make sure you: ? Wash your hands with soap and water before and after you change your bandage (dressing). If you cannot use soap and water, use hand sanitizer. ? Change your bandage as told by your doctor. ? Leave stitches (sutures), skin glue, or skin tape (adhesive) strips in place. They may need to stay in place for 2 weeks or longer. If tape strips get loose and curl up, you may trim the loose edges. Do not remove tape strips completely unless your doctor says it is okay.  Check your cuts from surgery every day for signs of infection. Check  for: ? Redness, swelling, or pain. ? Fluid or blood. ? Warmth. ? Pus or a bad smell. Bathing  Keep your cuts from surgery clean and dry. Clean them as told by your doctor. To do this: 1. Gently wash the cuts with soap and water. 2. Rinse the cuts with water to remove all soap. 3. Pat the cuts dry with a clean towel. Do not rub the cuts.  Do not take baths, swim, or use a hot tub for 2 weeks, or until your doctor says it is okay. You may take showers after 48 hours. Activity   Do not drive for 24 hours if you were given a medicine to help you relax (sedative) during your procedure.  Rest after the procedure. Return to your normal activities as told by your doctor. Ask your doctor what activities are safe for you.  For 3 weeks, or for as long as told by your doctor: ? Do not lift anything that is heavier than 10 lb (4.5 kg), or the limit that you are told. ? Do not play contact sports. General instructions  If you were sent home with a drain, follow instructions from your doctor on how to care for it.  Take deep breaths. This helps to keep your lungs from getting an infection (pneumonia).  Keep all follow-up visits as told by your doctor. This is important. Contact a doctor if:  You have redness, swelling, or pain  around a cut from surgery.  You have fluid or blood coming from a cut.  Your cut feels warm to the touch.  You have pus or a bad smell coming from a cut or a bandage.  The edges of a cut break open after the stitches have been taken out.  You have pain in your shoulders that gets worse.  You feel dizzy or you pass out (faint).  You have shortness of breath.  You keep feeling sick to your stomach (nauseous).  You keep throwing up (vomiting).  You get watery poop (diarrhea) or you cannot control your poop.  You lose your appetite.  You have swelling or pain in your legs.  You get a rash. Get help right away if:  You have a fever.  You have trouble  breathing.  You have sharp pains in your chest. Summary  After the procedure, it is common to have low energy, mild pain, and trouble pooping.  Infection is a common problem after this procedure. Follow your doctor's instructions about caring for yourself after the procedure.  Rest after the procedure. Return to your normal activities as told by your doctor.  Contact your doctor if you see signs of infection around your cuts from surgery, or you get short of breath. Get help right away if you have a fever, chest pain, or trouble breathing. This information is not intended to replace advice given to you by your health care provider. Make sure you discuss any questions you have with your health care provider. Document Released: 12/24/2008 Document Revised: 08/30/2017 Document Reviewed: 08/30/2017 Elsevier Patient Education  2020 [employer].

## 2019-01-28 NOTE — Progress Notes (Addendum)
1 Day Post-Op    CC: Abdominal pain  Subjective: Patient looks okay this AM.  He says he felt rather bloated after eating some eggs this a.m.  Positive bowel sounds port sites look okay he may be just a little bit bloated.  Objective: Vital signs in last 24 hours: Temp:  [97.9 F (36.6 C)-100.5 F (38.1 C)] 98.8 F (37.1 C) (11/17 0618) Pulse Rate:  [103-116] 103 (11/17 0618) Resp:  [14-20] 20 (11/17 0618) BP: (120-146)/(70-95) 131/79 (11/17 0618) SpO2:  [96 %-99 %] 98 % (11/17 0618) Weight:  [68.5 kg] 68.5 kg (11/16 1101) Last BM Date: 01/27/19 300 p.o. 4000 IV 1825 urine T-max 100.5 preop No labs this a.m.      Intake/Output from previous day: 11/16 0701 - 11/17 0700 In: 4358.8 [P.O.:300; I.V.:2810.1; IV Piggyback:1248.7] Out: 1875 [Urine:1825; Blood:50] Intake/Output this shift: Total I/O In: 240 [P.O.:240] Out: 700 [Urine:700]  General appearance: alert, cooperative and no distress Resp: clear to auscultation bilaterally GI: Sore, port sites all look good.  He may be slightly bloated.  Lab Results:  Recent Labs    01/27/19 1110  WBC 17.7*  HGB 15.4  HCT 45.8  PLT 211    BMET Recent Labs    01/27/19 1110  NA 138  K 4.1  CL 104  CO2 24  GLUCOSE 120*  BUN 10  CREATININE 0.86  CALCIUM 9.5   PT/INR No results for input(s): LABPROT, INR in the last 72 hours.  Recent Labs  Lab 01/27/19 1110  AST 61*  ALT 102*  ALKPHOS 106  BILITOT 1.4*  PROT 8.5*  ALBUMIN 4.8     Lipase     Component Value Date/Time   LIPASE 30 01/27/2019 1110     Prior to Admission medications   Medication Sig Start Date End Date Taking? Authorizing Provider  acetaminophen (TYLENOL) 500 MG tablet Take 1,000 mg by mouth every 6 (six) hours as needed for moderate pain.   Yes [provider]  amLODipine (NORVASC) 10 MG tablet Take by mouth. 12/23/18  Yes [provider]  famotidine (PEPCID) 20 MG tablet TAKE 1 TABLET(20 MG) BY MOUTH TWICE DAILY.  12/16/18  Yes [provider]  fenofibrate 160 MG tablet Take 160 mg by mouth daily.  12/23/18  Yes [provider]    Medications: . enoxaparin (LOVENOX) injection  40 mg Subcutaneous Q24H   . dextrose 5 % and 0.45 % NaCl with KCl 20 mEq/L 100 mL/hr at 01/28/19 0516  . 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 Laparoscopic appendectomy 01/27/2019, Dr. Ovidio Kin  FEN: IV fluids/regular diet ID: Ceftriaxone/Flagyl preop; Zosyn 11/16 >> DVT: Lovenox Follow-up: Dr. Ezzard Standing Plan: Try and get him out later today.  I want him to eat lunch and make sure he does not have any postop ileus and is tolerating the diet well.  We plan to send him home on another week of oral antibiotics and I will switch him over to Augmentin at the time of discharge.  I will have him follow-up with Dr. Ezzard Standing since there is concern about the pathology of his cecum.  Still bloated after lunch.  No nausea but we will watch tonight and aim for DC in AM if he does well.    LOS: 0 days    Sem Mccaughey  01/28/2019 Please see Amion

## 2019-01-29 LAB — CBC
HCT: 39.1 % (ref 39.0–52.0)
Hemoglobin: 12.8 g/dL — ABNORMAL LOW (ref 13.0–17.0)
MCH: 29.7 pg (ref 26.0–34.0)
MCHC: 32.7 g/dL (ref 30.0–36.0)
MCV: 90.7 fL (ref 80.0–100.0)
Platelets: 199 10*3/uL (ref 150–400)
RBC: 4.31 MIL/uL (ref 4.22–5.81)
RDW: 12.9 % (ref 11.5–15.5)
WBC: 8.9 10*3/uL (ref 4.0–10.5)
nRBC: 0 % (ref 0.0–0.2)

## 2019-01-29 LAB — SURGICAL PATHOLOGY

## 2019-01-29 NOTE — Progress Notes (Signed)
Pt alert, oriented, tolerating diet. D/C instructions given. Pt d/cd home with spouse.

## 2019-01-29 NOTE — Discharge Summary (Signed)
Physician Discharge Summary  Patient ID: Antonio Marsh MRN: [id number] DOB/AGE: 1985-12-05 33 y.o.  Admit date: 01/27/2019 Discharge date: 01/29/2019  Admission Diagnoses:  Acute appendicitis Left ureteral vesicular stone  Discharge Diagnoses:  Acute appendicitis with focal abscess, possible bulbous mass of cecum at base of the appendix. Left ureteral vesicular stone.   Active Problems:   Acute appendicitis   PROCEDURES: Laparoscopic appendectomy 01/27/2019 Dr. Alfredia Ferguson Course:  Antonio Marsh is a 33 y.o. (DOB: 1985/08/17)  Hispanic male whose primary care physician is Wilfred Curtis, MD. He developed abdominal bloating and diffuse abdominal tenderness yesterday, 11/15.   He said that he had a similar episode about one year ago, but this resolved. He has no prior history of stomach, liver, or colon disease.  He has had no prior abdominal surgery             Because of increasing discomfort, he came to the [facility]. CT scan of abdomen - 01/27/2019 - 1. Findings indicative of acute appendicitis. 2. 2 mm calculus left ureterovesical junction. No appreciable hydronephrosis. 3. Prominent lymph nodes in the right lower quadrant may well be reactive in etiology given the appendicitis. WBC - 01/27/2019 - 17,700  He was seen in the emergency department taken the operating room by Dr. Ezzard Standing.  He underwent laparoscopic appendectomy with findings suggestive of a mass at the base of the cecum.  He completed the appendectomy and patient returned to the floor in satisfactory condition.  He was maintained on IV antibiotics throughout the first postoperative day.  He was converted to oral antibiotics on the second postoperative morning.  He was tolerated diet and bowel function had returned by the second postoperative morning and he was ready for discharge.  Pathology from his surgery has not been completed yet.  His port sites all look fine.  He will be discharged today  and will follow-up with Dr. Ezzard Standing in December.  CBC Latest Ref Rng & Units 01/29/2019 01/27/2019  WBC 4.0 - 10.5 K/uL 8.9 17.7(H)  Hemoglobin 13.0 - 17.0 g/dL 12.8(L) 15.4  Hematocrit 39.0 - 52.0 % 39.1 45.8  Platelets 150 - 400 K/uL 199 211   CMP Latest Ref Rng & Units 01/28/2019 01/27/2019  Glucose 70 - 99 mg/dL 149(F) 026(V)  BUN 6 - 20 mg/dL 8 10  Creatinine 7.85 - 1.24 mg/dL 8.85 0.27  Sodium 741 - 145 mmol/L 137 138  Potassium 3.5 - 5.1 mmol/L 3.6 4.1  Chloride 98 - 111 mmol/L 105 104  CO2 22 - 32 mmol/L 23 24  Calcium 8.9 - 10.3 mg/dL 9.0 9.5  Total Protein 6.5 - 8.1 g/dL 7.3 2.8(N)  Total Bilirubin 0.3 - 1.2 mg/dL 1.2 8.6(V)  Alkaline Phos 38 - 126 U/L 72 106  AST 15 - 41 U/L 36 61(H)  ALT 0 - 44 U/L 65(H) 102(H)    Condition on discharge: Improved  Disposition: Discharge disposition: 01-Home or Self Care        Allergies as of 01/29/2019   No Known Allergies     Medication List    TAKE these medications   acetaminophen 500 MG tablet Commonly known as: TYLENOL You can take 1000 mg of Tylenol every 8 hours as needed for pain.  For the first 24-48 hours I would just take it scheduled.  As your pain improves you can go back to using it every 8 hours as needed.  You can alternate this with plain ibuprofen.  Do not take more  than 4000 mg of Tylenol/acetaminophen per day, it can harm your liver. What changed:   how much to take  how to take this  when to take this  reasons to take this  additional instructions   amLODipine 10 MG tablet Commonly known as: NORVASC Take by mouth.   amoxicillin-clavulanate 875-125 MG tablet Commonly known as: AUGMENTIN Take 1 tablet by mouth every 12 (twelve) hours.   famotidine 20 MG tablet Commonly known as: PEPCID TAKE 1 TABLET(20 MG) BY MOUTH TWICE DAILY.   fenofibrate 160 MG tablet Take 160 mg by mouth daily.   ibuprofen 200 MG tablet Commonly known as: ADVIL You can take 2 to 3 tablets every 6 hours as  needed for pain not relieved by plain Tylenol/acetaminophen.  You can start this about 2 hours after you take your Tylenol.  You can alternate the drugs.  You can buy this over-the-counter at any drugstore.   oxyCODONE 5 MG immediate release tablet Commonly known as: Oxy IR/ROXICODONE Take 1 tablet (5 mg total) by mouth every 6 (six) hours as needed for severe pain or breakthrough pain (Pain not relieved by Tylenol/ibuprofen).      Follow-up Information    Alphonsa Overall, MD Follow up on 02/20/2019.   Specialty: General Surgery Why: Your appointment is at 4 PM.  Be at the office 30 minutes early for check-in.  Bring photo ID and insurance information. Contact information: [address] [place] [address] [phone]        Margarito Courser, MD Follow up.   Specialty: Internal Medicine Why: Call Dr.Gavour and have him follow on your kidney stone. Contact information: [address] [place] [address] [phone]           Signed: Earnstine Regal 01/29/2019, 8:24 AM

## 2019-01-29 NOTE — Plan of Care (Signed)
All goals met for d/cd.

## 2019-01-29 NOTE — Progress Notes (Signed)
2 Days Post-Op    CC: Abdominal pain  Subjective: Patient sitting up in bed is much more comfortable this a.m.  He has not eaten breakfast but he did well with supper.  He is ambulating pains well controlled with oral pain medications.  Objective: Vital signs in last 24 hours: Temp:  [97.9 F (36.6 C)-98.4 F (36.9 C)] 97.9 F (36.6 C) (11/18 0620) Pulse Rate:  [89-104] 89 (11/18 0620) Resp:  [16-18] 18 (11/18 0620) BP: (125-141)/(71-83) 126/72 (11/18 0620) SpO2:  [97 %-100 %] 100 % (11/18 0620) Last BM Date: 01/27/19 480 p.o. 1650 IV 3100 urine BM x1 Afebrile vital signs are stable Labs stable. Intake/Output from previous day: 11/17 0701 - 11/18 0700 In: 2126.1 [P.O.:480; I.V.:1496; IV Piggyback:150.1] Out: 3100 [Urine:3100] Intake/Output this shift: No intake/output data recorded.  General appearance: alert, cooperative and no distress Resp: clear to auscultation bilaterally GI: Soft, sore but otherwise port sites look fine.  Positive BM.  Lab Results:  Recent Labs    01/27/19 1110 01/29/19 0249  WBC 17.7* 8.9  HGB 15.4 12.8*  HCT 45.8 39.1  PLT 211 199    BMET Recent Labs    01/27/19 1110 01/28/19 1458  NA 138 137  K 4.1 3.6  CL 104 105  CO2 24 23  GLUCOSE 120* 138*  BUN 10 8  CREATININE 0.86 0.86  CALCIUM 9.5 9.0   PT/INR No results for input(s): LABPROT, INR in the last 72 hours.  Recent Labs  Lab 01/27/19 1110 01/28/19 1458  AST 61* 36  ALT 102* 65*  ALKPHOS 106 72  BILITOT 1.4* 1.2  PROT 8.5* 7.3  ALBUMIN 4.8 4.0     Lipase     Component Value Date/Time   LIPASE 30 01/27/2019 1110     Medications: . acetaminophen  1,000 mg Oral Q8H  . amoxicillin-clavulanate  1 tablet Oral Q12H  . enoxaparin (LOVENOX) injection  40 mg Subcutaneous Q24H    Assessment/Plan Left ureteral vesicle stone on CT scan Hx elevated cholesterol Hx hypertension  Acute appendicitis with focal abscess, possible bullous mass of cecum at the base of  the appendix. Laparoscopic appendectomy 01/27/2019, Dr. Alphonsa Overall; POD #2  - Pathology pending  FEN: IV fluids/regular diet ID: Ceftriaxone/Flagyl preop; Zosyn 11/16 >>day 3 DVT: Lovenox Follow-up: Dr. Lucia Gaskins   Plan: Discharge home today.  Pathology is still pending.  7 days of Augmentin at home.  LOS: 0 days    Lannah Koike 01/29/2019 Please see Amion

## 2021-05-01 IMAGING — CT CT ABD-PELV W/ CM
1 series · 14 of 32 positions shown, 18 images · IV contrast (Omnipaque)
Comparison: None.

CLINICAL DATA: Abdominal pain and nausea

EXAM:
CT ABDOMEN AND PELVIS WITH CONTRAST
TECHNIQUE: Multidetector CT imaging of the abdomen and pelvis was performed
using the standard protocol following bolus administration of
intravenous contrast.
CONTRAST:  100mL OMNIPAQUE IOHEXOL 300 MG/ML  SOLN

[Series 4: lung bases · axial · 0.74mm/px · z∈[-148,-8]mm · 14 of 33 slices shown, 18 images]
[im 3/33  soft-tissue]
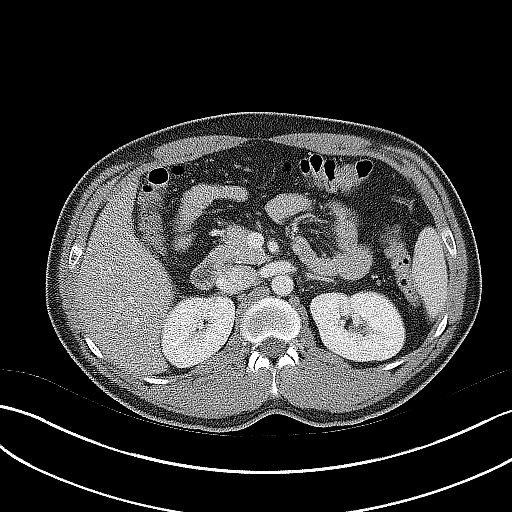
[im 3/33  bone]
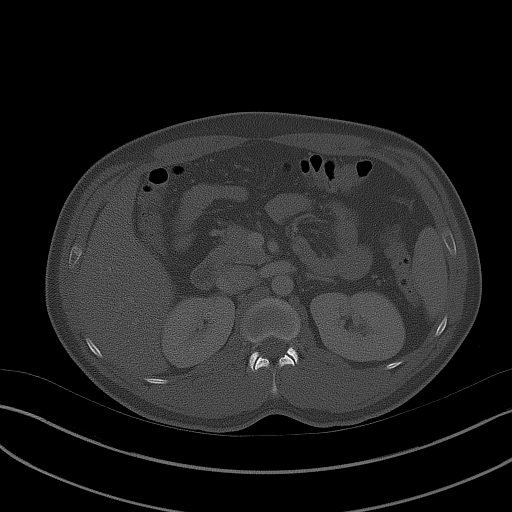
[im 5/33  soft-tissue]
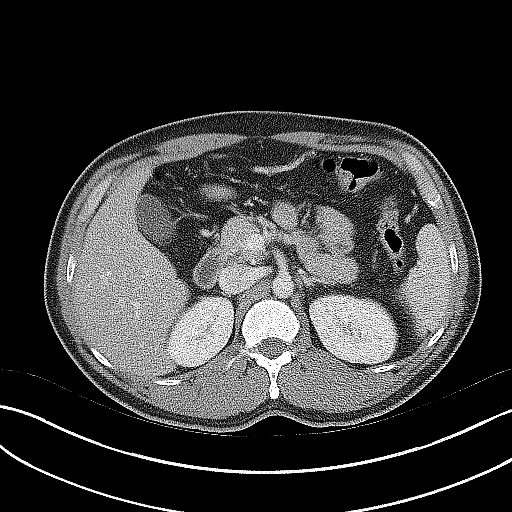
[im 8/33  soft-tissue]
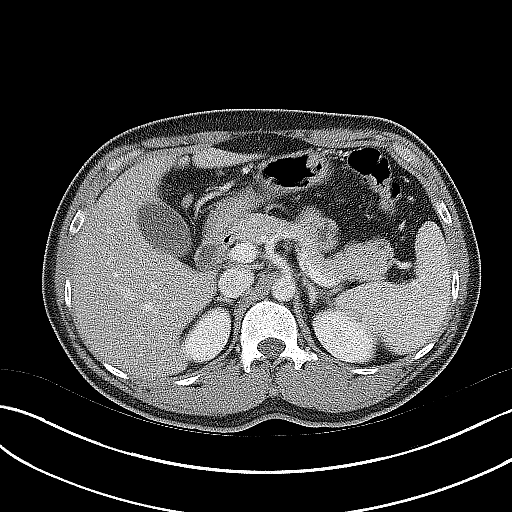
[im 10/33  soft-tissue]
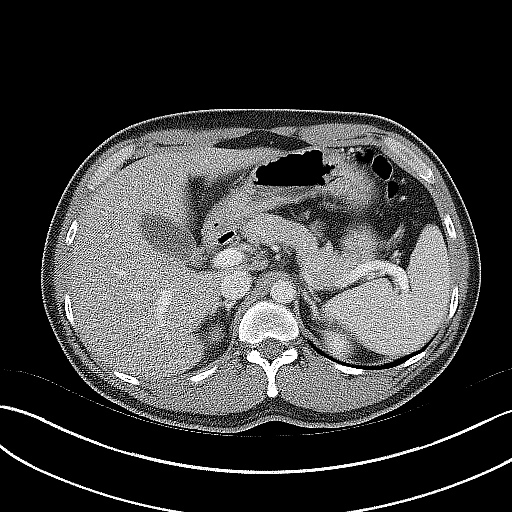
[im 13/33  soft-tissue]
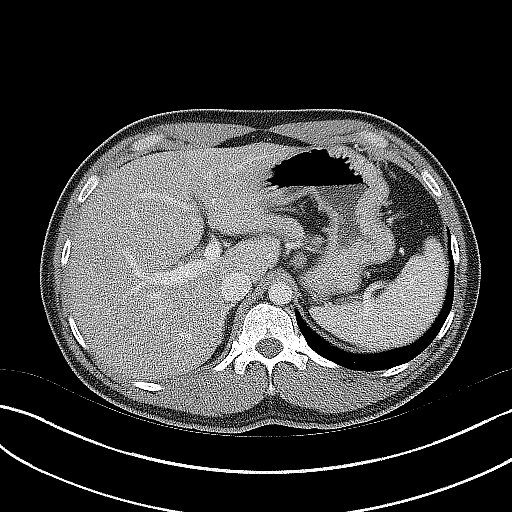
[im 15/33  soft-tissue]
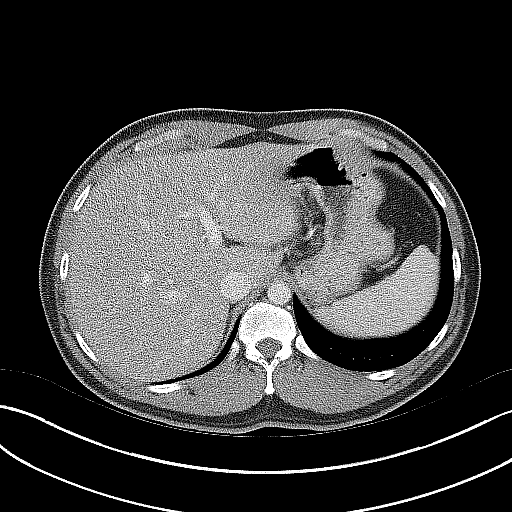
[im 18/33  soft-tissue]
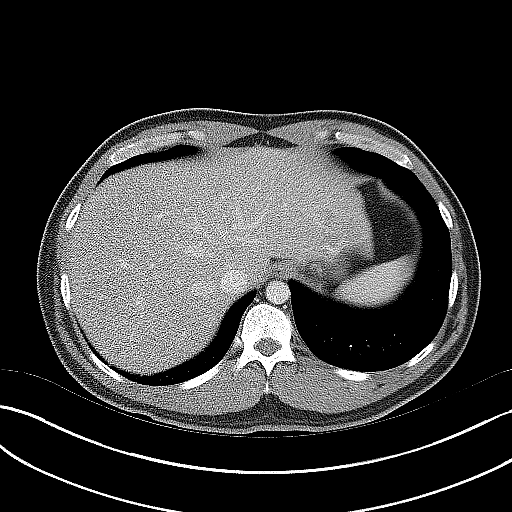
[im 20/33  soft-tissue]
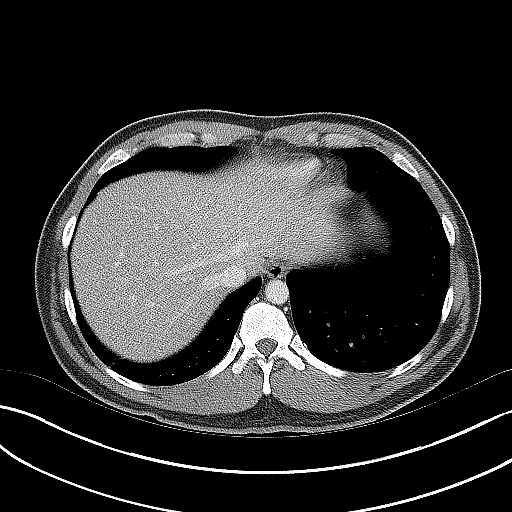
[im 23/33  soft-tissue]
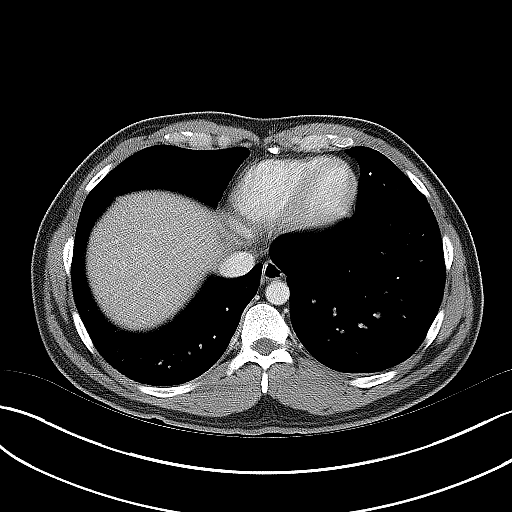
[im 23/33  bone]
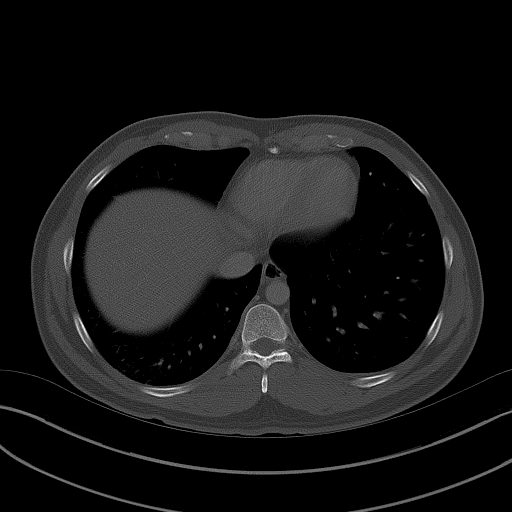
[im 25/33  soft-tissue]
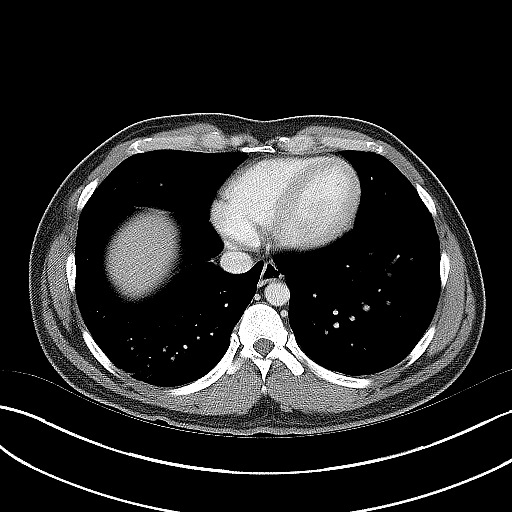
[im 28/33  soft-tissue]
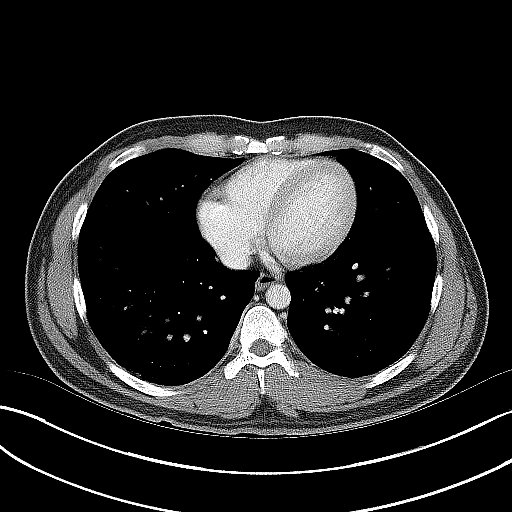
[im 28/33  lung]
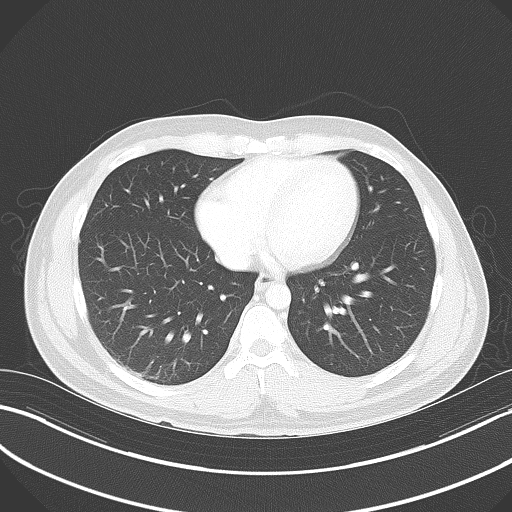
[im 29/33  lung]
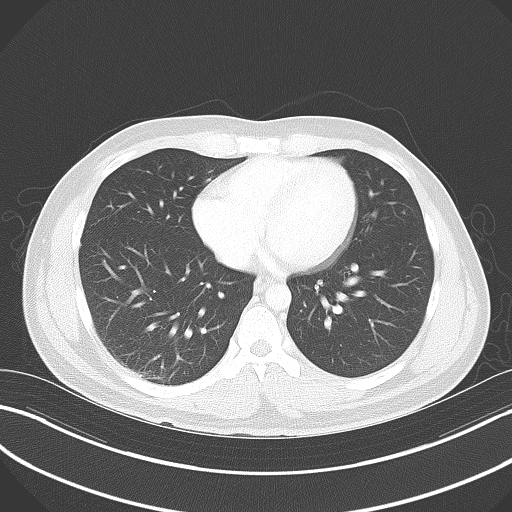
[im 30/33  soft-tissue]
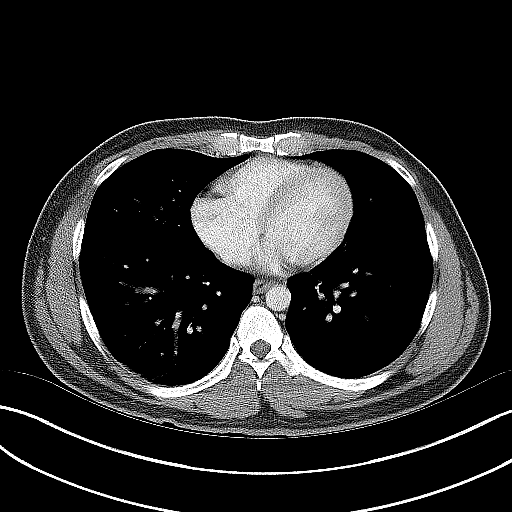
[im 30/33  lung]
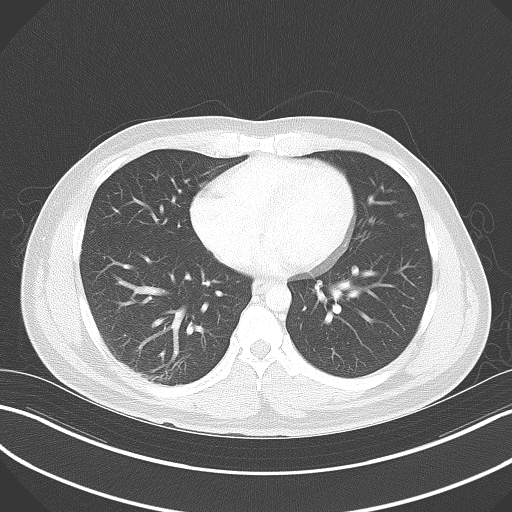
[im 31/33  lung]
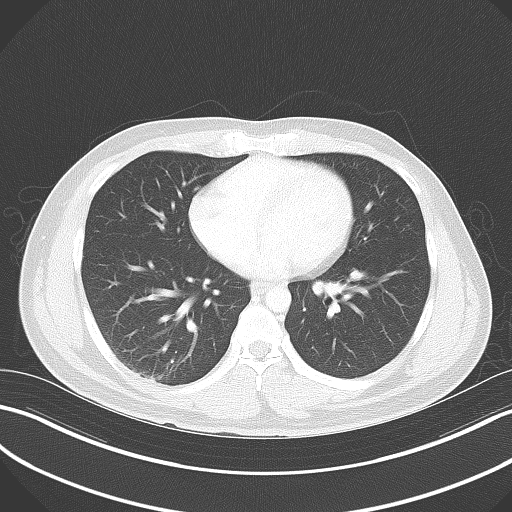

[14 of 32 positions shown; findings below may reference images not displayed]

FINDINGS: Lower chest: There is atelectatic change in the posterior right
base. Lung bases otherwise are clear.

Hepatobiliary: There is hepatic steatosis. No focal liver lesions
are evident. Gallbladder wall is not appreciably thickened. There is
no biliary duct dilatation.

Pancreas: There is no pancreatic mass or inflammatory focus.

Spleen: No splenic lesions are evident.

Adrenals/Urinary Tract: Adrenals bilaterally appear normal. Kidneys
bilaterally show no evident mass or hydronephrosis on either side.
There is no renal calculus on either side. There is a 2 mm calculus
at the left ureterovesical junction which is not causing appreciable
hydronephrosis. No other ureteral calculi evident. Urinary bladder
is midline with wall thickness within normal limits.

Stomach/Bowel: There is no appreciable bowel wall or mesenteric
thickening. There is no bowel obstruction. The terminal ileum
appears unremarkable. There is no free air or portal venous air.

Vascular/Lymphatic: There is no abdominal aortic aneurysm. No
vascular lesions are evident. There are several borderline prominent
lymph nodes in the right lower quadrant, largest measuring 1.0 cm in
short axis diameter. No adenopathy is seen apart from the right
lower quadrant.

Reproductive: Prostate and seminal vesicles are normal in size and
contour. No pelvic mass evident.

Other: The appendix measures 1.7 cm in thickness. There is
enhancement of the appendiceal wall. There is soft tissue stranding
and slight fluid tracking along the appendix. These are changes
indicative of acute appendicitis. No abscess or perforation noted in
this area.

No abscess or ascites evident in the abdomen or pelvis.

Musculoskeletal: There are no blastic or lytic bone lesions.
IMPRESSION: 1.  Findings indicative of acute appendicitis.

Appendix: Location: Appendix arises medially from the cecum at the
level of L5-S1.

Diameter: 1.7 cm

Appendicolith: None

Mucosal hyper-enhancement: Present

Extraluminal gas: None

Periappendiceal collection: Slight fluid and moderate soft tissue
stranding track along the inflamed appendix. No abscess evident.

2. 2 mm calculus left ureterovesical junction. No appreciable
hydronephrosis.

3. Prominent lymph nodes in the right lower quadrant may well be
reactive in etiology given the appendicitis.

4.  No bowel obstruction.  No abscess in the abdomen or pelvis.

Critical Value/emergent results were called by telephone at the time
MARJA , who verbally acknowledged these results.
